# Patient Record
Sex: Female | Born: 1955 | Race: White | Hispanic: No
Health system: Southern US, Community
[De-identification: ages and names within clinical notes are randomized; demographics above are authoritative.]

---

## 2004-11-20 ENCOUNTER — Emergency Department: Payer: Self-pay | Admitting: Emergency Medicine

## 2006-05-30 ENCOUNTER — Ambulatory Visit: Payer: Self-pay | Admitting: Unknown Physician Specialty

## 2007-06-07 ENCOUNTER — Ambulatory Visit: Payer: Self-pay | Admitting: Unknown Physician Specialty

## 2014-05-05 ENCOUNTER — Observation Stay: Payer: Self-pay | Admitting: Surgery

## 2014-07-20 LAB — SURGICAL PATHOLOGY

## 2014-07-26 NOTE — H&P (Signed)
Subjective/Chief Complaint severe RLQ abdominal pain   History of Present Illness 59 y/o o/w healthy female presents with 14 hours of severe RLQ crampy abominal pain and some nausea.  no sick contacts. no diarrhea.  Pain is constant, gassy and located in RLQ and radiating into her right flank as well. W/u in Er suggestive of acute appendicitis.  No prior history of similar type pain.  Accompanied by her son.   Past History migraine headaches. foot surgert in past. no abdominal operations.   Code Status Full Code   Past Med/Surgical Hx:  Gallstones:   Migraines:   Denies surgical history.:   ALLERGIES:  No Known Allergies:   HOME MEDICATIONS: Medication Instructions Status  Imitrex   once a day  Active  Allegra   once a day  Active   Family and Social History:  Family History Hypertension  Smoking   Social History negative tobacco, negative ETOH, negative Illicit drugs   Place of Living Home   Review of Systems:  Subjective/Chief Complaint see above, remaining 10 point review negative.   Abdominal Pain Yes   Nausea/Vomiting Yes   Tolerating Diet No  last meal last evening.   Medications/Allergies Reviewed Medications/Allergies reviewed   Physical Exam:  GEN no acute distress, thin, disheveled, 98.2 94 bp 151/75 rr 18.   HEENT pale conjunctivae, PERRL   NECK supple   RESP normal resp effort  clear BS   CARD regular rate   ABD positive tenderness  positive Flank Tenderness  no hernia  soft  tender in rlq and along right flank.  postive mcburney's sign   LYMPH negative neck   EXTR negative cyanosis/clubbing   SKIN normal to palpation, No rashes   NEURO cranial nerves intact   PSYCH A+O to time, place, person, good insight   Lab Results: Hepatic:  09-Feb-16 11:56   Bilirubin, Total -  Alkaline Phosphatase -  SGPT (ALT) -  SGOT (AST) -  Total Protein, Serum -  Albumin, Serum -    12:45   Bilirubin, Total 0.3  Alkaline Phosphatase 86  SGPT  (ALT) 57  SGOT (AST) 34  Total Protein, Serum 7.1  Albumin, Serum 3.8  Routine Chem:  09-Feb-16 11:56   Lipase - (Result(s) reported on 05 May 2014 at 12:39PM.)  Glucose, Serum -  BUN -  Creatinine (comp) -  Sodium, Serum -  Potassium, Serum -  Chloride, Serum -  CO2, Serum -  Calcium (Total), Serum -  Osmolality (calc) -  eGFR (African American) -  eGFR (Non-African American) - (eGFR values <70m/min/1.73 m2 may be an indication of chronic kidney disease (CKD). Calculated eGFR, using the MRDR Study equation, is useful in  patients with stable renal function. The eGFR calculation will not be reliable in acutely ill patients when serum creatinine is changing rapidly. It is not useful in patients on dialysis. The eGFR calculation may not be applicable to patients at the low and high extremes of body sizes, pregnant women, and vegetarians.)  Anion Gap -  Result Comment Chemistries - Specimen grossly hemolyzed. Unable to  - perform testing. Spoke with EGatha Mayer@ [id number]/16. BGB  Result(s) reported on 05 May 2014 at 12:38PM.    12:45   Lipase 153 (Result(s) reported on 05 May 2014 at 01:09PM.)  Glucose, Serum  135  BUN  21  Creatinine (comp) 0.85  Sodium, Serum 138  Potassium, Serum 3.7  Chloride, Serum 106  CO2, Serum 23  Calcium (Total),  Serum 9.0  Osmolality (calc) 281  eGFR (African American) >60  eGFR (Non-African American) >60 (eGFR values <28m/min/1.73 m2 may be an indication of chronic kidney disease (CKD). Calculated eGFR, using the MRDR Study equation, is useful in  patients with stable renal function. The eGFR calculation will not be reliable in acutely ill patients when serum creatinine is changing rapidly. It is not useful in patients on dialysis. The eGFR calculation may not be applicable to patients at the low and high extremes of body sizes, pregnant women, and vegetarians.)  Anion Gap 9  Cardiac:  09-Feb-16 11:56   Troponin I -  (0.00-0.05 0.05 ng/mL or less: NEGATIVE  Repeat testing in 3-6 hrs  if clinically indicated. >0.05 ng/mL: POTENTIAL  MYOCARDIAL INJURY. Repeat  testing in 3-6 hrs if  clinically indicated. NOTE: An increase or decrease  of 30% or more on serial  testing suggests a  clinically important change)    12:45   Troponin I < 0.02 (0.00-0.05 0.05 ng/mL or less: NEGATIVE  Repeat testing in 3-6 hrs  if clinically indicated. >0.05 ng/mL: POTENTIAL  MYOCARDIAL INJURY. Repeat  testing in 3-6 hrs if  clinically indicated. NOTE: An increase or decrease  of 30% or more on serial  testing suggests a  clinically important change)  Routine UA:  09-Feb-16 11:56   Color (UA) Yellow  Clarity (UA) Hazy  Glucose (UA) Negative  Bilirubin (UA) Negative  Ketones (UA) Negative  Specific Gravity (UA) 1.027  Blood (UA) Negative  pH (UA) 5.0  Protein (UA) Negative  Nitrite (UA) Negative  Leukocyte Esterase (UA) Negative (Result(s) reported on 05 May 2014 at 02:33PM.)  RBC (UA) 2 /HPF  WBC (UA) <1 /HPF  Bacteria (UA) TRACE  Epithelial Cells (UA) 1 /HPF  Mucous (UA) PRESENT  Calcium Oxalate Crystal (UA) PRESENT (Result(s) reported on 05 May 2014 at 02:33PM.)  Routine Hem:  09-Feb-16 11:56   WBC (CBC)  15.3  RBC (CBC) 4.73  Hemoglobin (CBC) 14.4  Hematocrit (CBC) 44.5  Platelet Count (CBC) 331  MCV 94  MCH 30.5  MCHC 32.4  RDW 13.5  Neutrophil % 86.4  Lymphocyte % 10.0  Monocyte % 2.9  Eosinophil % 0.1  Basophil % 0.6  Neutrophil #  13.2  Lymphocyte # 1.5  Monocyte # 0.5  Eosinophil # 0.0  Basophil # 0.1 (Result(s) reported on 05 May 2014 at 12:25PM.)   Radiology Results: [place]    09-Feb-16 14:53, [place]  [place]  REASON FOR EXAM:    abd pain  COMMENTS:       PROCEDURE: [place] - [place]  - May 05 2014  2:53PM     CLINICAL DATA:  Sudden onset abdominal pain and distention with  nausea and vomiting    EXAM:  ULTRASOUND  ABDOMEN COMPLETE    COMPARISON:  None.    FINDINGS:  Gallbladder: No gallstones or wall thickening visualized. No  sonographic Murphy sign noted.    Common bile duct: Diameter: 5 mm    Liver: Prominent liver echogenicity with poor visualization of  portal triads. No focal lesion.    IVC: No abnormality visualized.    Pancreas: Mostly obscured by gas, visualized portion unremarkable.    Spleen: Size and appearance within normal limits.    Right Kidney: Length: 11.5 cm. Echogenicity within normal limits. No  solid mass or hydronephrosis visualized.  Left Kidney: Length: 12.5 cm. Echogenicity within normal limits. No  mass or  hydronephrosis visualized.    Abdominal aorta: No aneurysm visualized.    Other findings: None.     IMPRESSION:  1. No findings to explain acute abdominal pain. The gallbladder is  negative.  2. Probable hepatic steatosis.      Electronically Signed    By: Monte Fantasia M.D.    On: 05/05/2014 15:10         Verified By: Gilford Silvius, M.D.,  [facility]:  PACS Image    09-Feb-16 15:59, CT Abdomen and Pelvis With Contrast  PACS Image  CT:  CT Abdomen and Pelvis With Contrast  REASON FOR EXAM:    (1) abd pain; (2) pel pain  COMMENTS:       PROCEDURE: CT  - CT ABDOMEN / PELVIS  W  - May 05 2014  3:59PM     CLINICAL DATA:  Acute onset of right upper quadrant pain with nausea  this morning.    EXAM:  CT ABDOMEN AND PELVIS WITH CONTRAST    TECHNIQUE:  Multidetector CT imaging of the abdomen and pelvis was performed  using the standard protocol following bolus administration of  intravenous contrast.  CONTRAST:  100 cc Omnipaque 300 IV.    COMPARISON:  None.    FINDINGS:  Minimal dependent and bibasilar atelectasis. Trace right pleural  effusion. Heart is normal size.    Diffuse fatty infiltration of the liver. No focal abnormality.  Gallbladder, spleen, pancreas, adrenals and kidneys are normal.    The appendix is located  lateral to the ascending colon, extending  towards the tip of the liver. The appendix is dilated with  surrounding inflammation compatible with acute appendicitis.  Appendix diameter approximately 10 mm. Probable appendicolith near  the base of the appendix.    Trace free fluid in the pelvis. Uterus, adnexae and urinary bladder  grossly unremarkable. Stomach, large and small bowel are  unremarkable.     IMPRESSION:  Dilated, inflamed appendix extending lateral to the ascending colon  towards the inferior edge of the liver. Findings compatible with  acute appendicitis.    Trace free fluid in the pelvis and trace right pleural effusion.    Diffuse fatty infiltration of the liver.  Electronically Signed    By: Rolm Baptise M.D.    On: 05/05/2014 16:16         Verified By: Raelyn Number, M.D.,    Assessment/Admission Diagnosis 60 y/o female with acute appendicitis I personally reviewed the CT scan images on PACS monitor. Migraine headaches-  I allowed her to take own imitrex after consulting with anesthesia.   Plan admit, IV zosyn already given in ER. Plan lap appendectomy later tonight with Dr Burt Knack all questions addressed and she and her son wish to proceed.   Electronic Signatures: Sherri Rad (MD)  (Signed 09-Feb-16 18:47)  Authored: CHIEF COMPLAINT and HISTORY, PAST MEDICAL/SURGIAL HISTORY, ALLERGIES, HOME MEDICATIONS, FAMILY AND SOCIAL HISTORY, REVIEW OF SYSTEMS, PHYSICAL EXAM, LABS, Radiology, ASSESSMENT AND PLAN   Last Updated: 09-Feb-16 18:47 by Sherri Rad (MD)

## 2014-07-26 NOTE — Op Note (Signed)
PATIENT NAME:  [name], Kristen Steele DATE OF BIRTH:  03/15/1956  DATE OF PROCEDURE:  05/05/2014  PREOPERATIVE DIAGNOSIS: Acute appendicitis.   POSTOPERATIVE DIAGNOSIS: Acute appendicitis.   PROCEDURE: Laparoscopic appendectomy.   SURGEON: Richard E. Excell Seltzerooper, MD   ANESTHESIA: General with endotracheal tube.   INDICATIONS: This is a patient with progressive right lower quadrant and right flank pain with a workup showing appendicitis. Preoperatively, we discussed rationale for surgery, the options of observation, risks of bleeding, infection, recurrence of symptoms, failure to resolve her symptoms, open procedure, bowel injury, and negative laparoscopy. This was all reviewed for her. She understood and agreed to proceed.   FINDINGS: Acute appendicitis. The appendix was right lateral but not retrocecal.   DESCRIPTION OF PROCEDURE: Patient was induced to general anesthesia. A Foley catheter was placed, VTE prophylaxis was in place, and IV antibiotics been given. She was then prepped and draped in a sterile fashion. Marcaine was infiltrated in skin and subcutaneous tissues around the periumbilical area. An incision was made. Veress needle was placed. Pneumoperitoneum was obtained. A 5 mm trocar port was placed. The abdominal cavity was explored and under direct vision a 5 mm suprapubic port and left lateral 12 mm port was placed. The appendix was identified in the right lateral position. It was elevated. The base of the appendix was divided with an EndoGIA standard load and then 2 firings of a vascular load EndoGIA was fired across the mesoappendix. The specimen was passed out through the lateral port site with the aid of an Endo catch bag. The area was checked for hemostasis and found to be adequate. It was irrigated with copious amounts of normal saline. There was no sign of bleeding or bowel injury. Therefore, under direct vision the left lateral port site was closed with Endo close technique  utilizing simple sutures of 0 Vicryl. Again, hemostasis was checked and found to be adequate. Pneumoperitoneum was released. All ports were removed; 4-0 subcuticular Monocryl was used at all skin edges. Steri-Strips, Mastisol, and sterile dressings were placed.   The patient tolerated the procedure well. There were no complications. She was taken to the recovery room in stable condition to be admitted for continued care.    ____________________________ Adah Salvageichard E. Excell Seltzerooper, MD rec:bm D: 05/05/2014 21:15:50 ET T: 05/05/2014 23:18:41 ET JOB#: [id number]  cc: Adah Salvageichard E. Excell Seltzerooper, MD, <Dictator> Lattie HawICHARD E COOPER MD ELECTRONICALLY SIGNED 05/06/2014 6:33

## 2016-07-14 IMAGING — CT CT ABD-PELV W/ CM
2 of 5 series · 16 of 46 positions shown, 18 images · IV contrast (omnipaque)
Comparison: None.

CLINICAL DATA: Acute onset of right upper quadrant pain with nausea
this morning.

EXAM:
CT ABDOMEN AND PELVIS WITH CONTRAST
TECHNIQUE: Multidetector CT imaging of the abdomen and pelvis was performed
using the standard protocol following bolus administration of
intravenous contrast.
CONTRAST:  100 cc Omnipaque 300 IV.

[Series 2: routine abd pel with · axial · 0.72mm/px · z∈[-498,-63]mm · 13 of 99 slices shown, 15 images]
[im 6/99  soft-tissue]
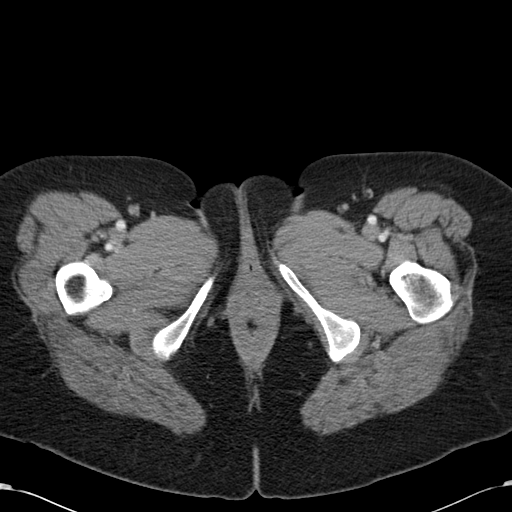
[im 6/99  bone]
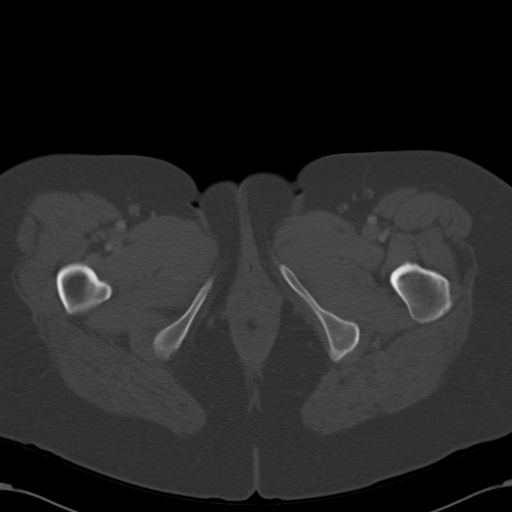
[im 16/99  soft-tissue]
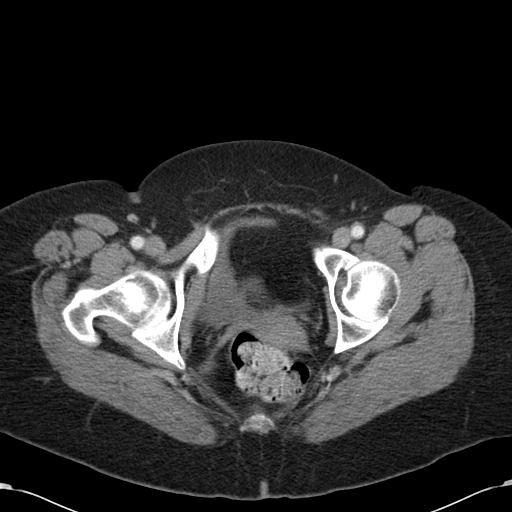
[im 21/99  soft-tissue]
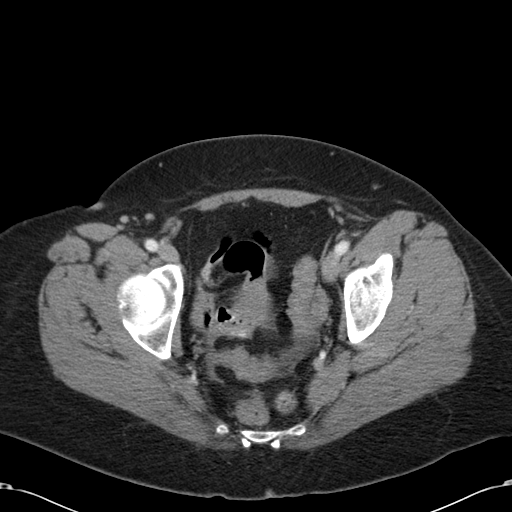
[im 26/99  soft-tissue]
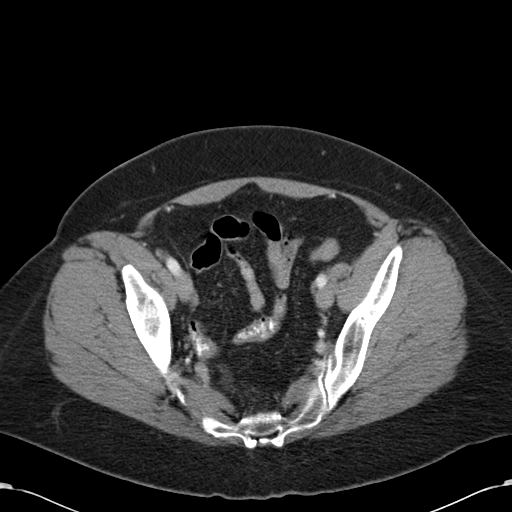
[im 37/99  soft-tissue]
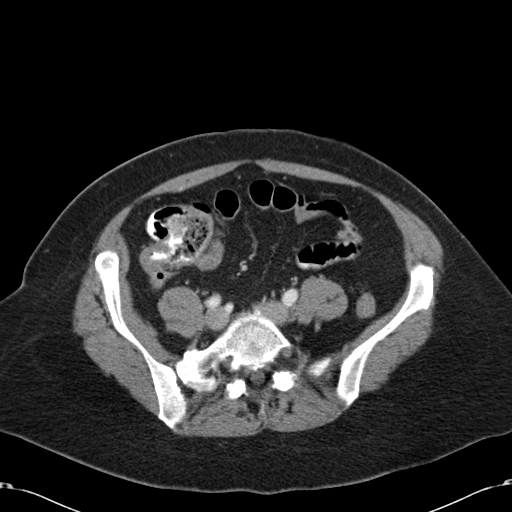
[im 42/99  soft-tissue]
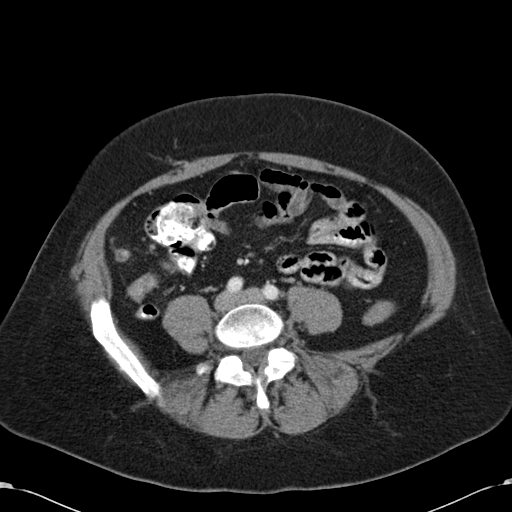
[im 52/99  soft-tissue]
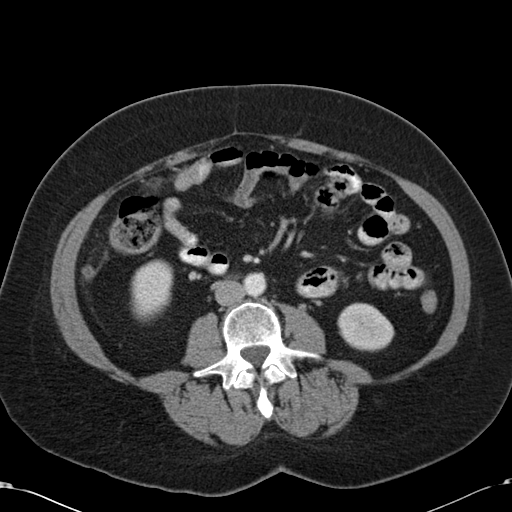
[im 57/99  soft-tissue]
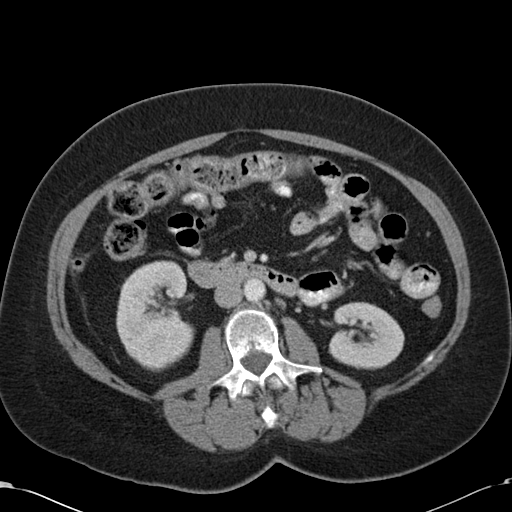
[im 62/99  soft-tissue]
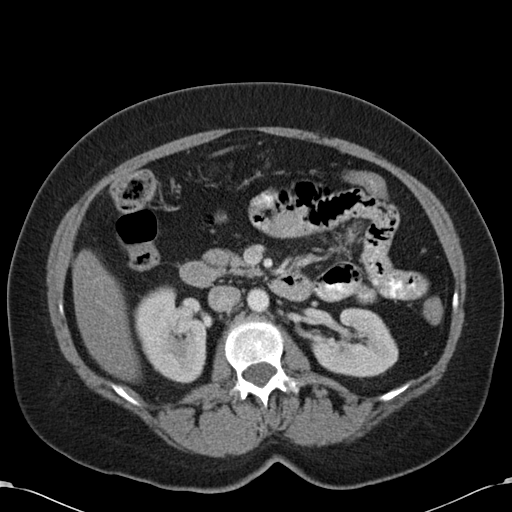
[im 62/99  bone]
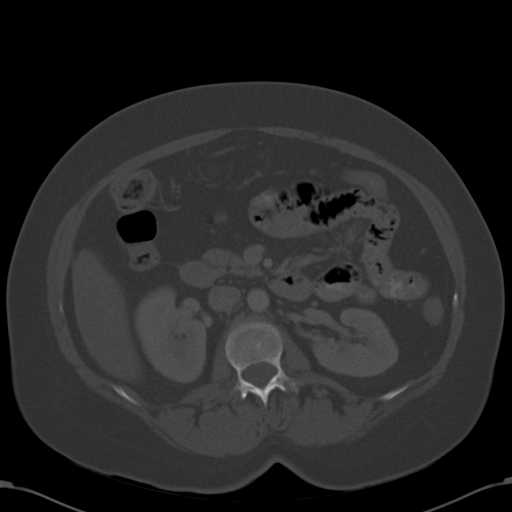
[im 73/99  soft-tissue]
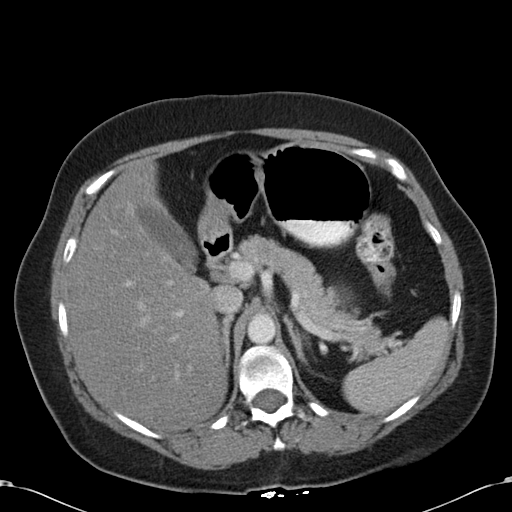
[im 78/99  soft-tissue]
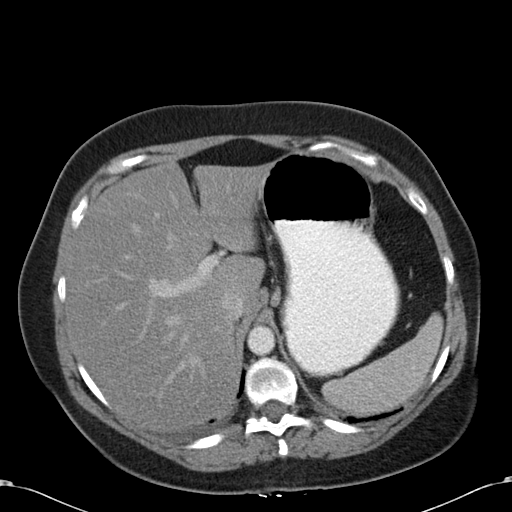
[im 83/99  soft-tissue]
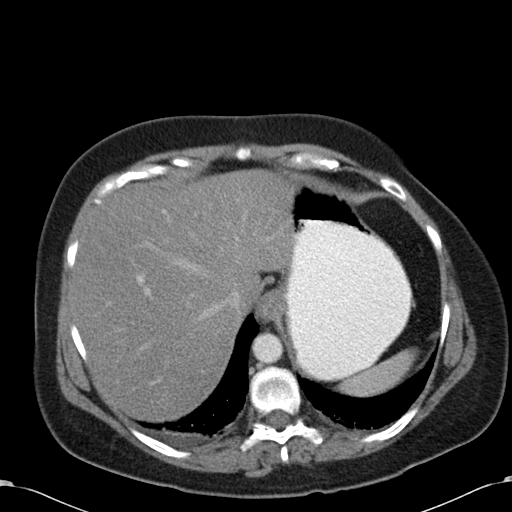
[im 93/99  soft-tissue]
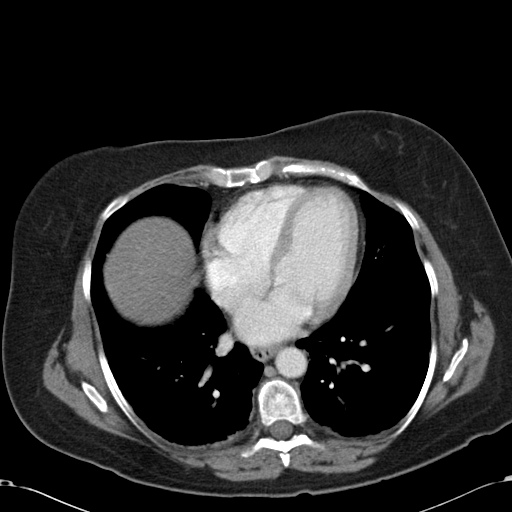

[Series 5: cor routine abd pel with · coronal · 0.73mm/px · 3 of 145 slices shown]
[im 49/145  soft-tissue]
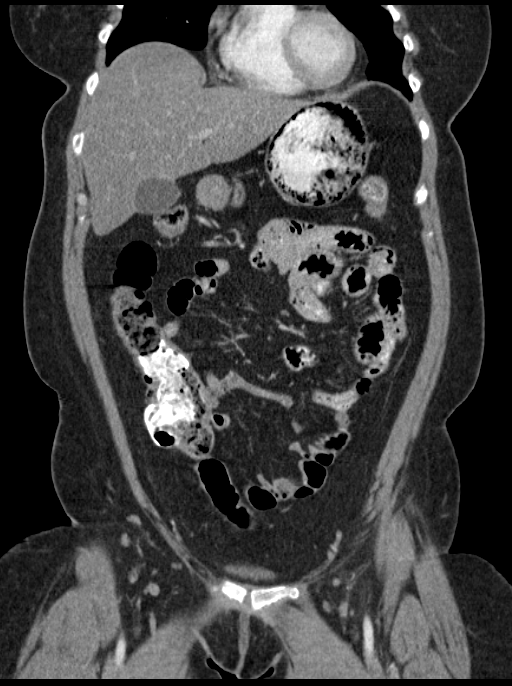
[im 65/145  soft-tissue]
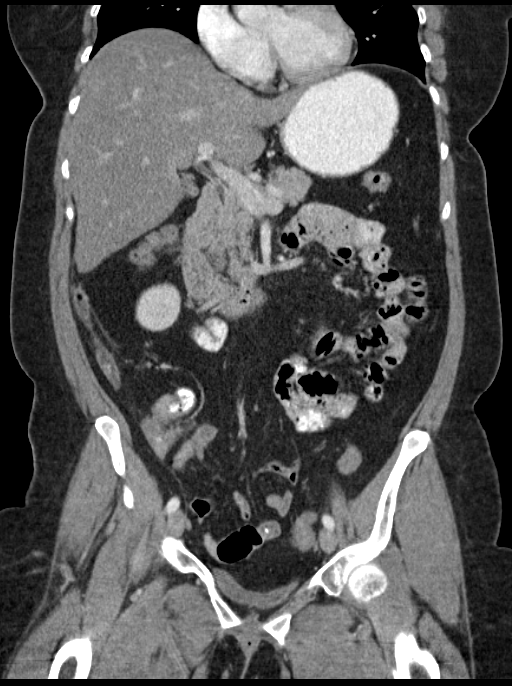
[im 81/145  soft-tissue]
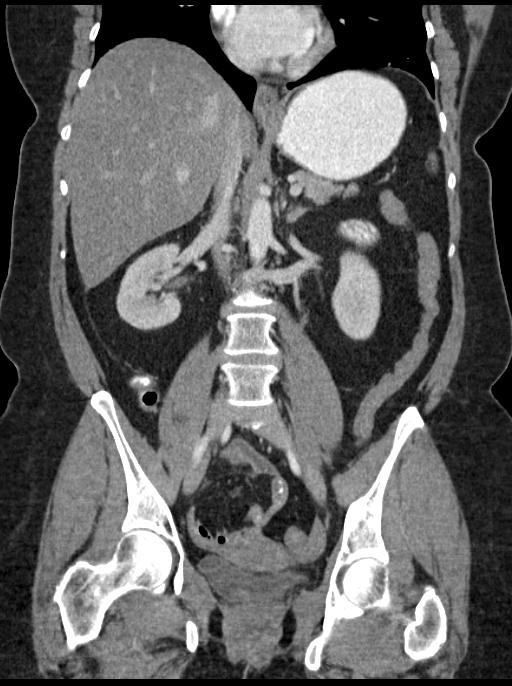

[16 of 46 positions shown; findings below may reference images not displayed]

FINDINGS: Minimal dependent and bibasilar atelectasis. Trace right pleural
effusion. Heart is normal size.

Diffuse fatty infiltration of the liver. No focal abnormality.
Gallbladder, spleen, pancreas, adrenals and kidneys are normal.

The appendix is located lateral to the ascending colon, extending
towards the tip of the liver. The appendix is dilated with
surrounding inflammation compatible with acute appendicitis.
Appendix diameter approximately 10 mm. Probable appendicolith near
the base of the appendix.

Trace free fluid in the pelvis. Uterus, adnexae and urinary bladder
grossly unremarkable. Stomach, large and small bowel are
unremarkable.
IMPRESSION: Dilated, inflamed appendix extending lateral to the ascending colon
towards the inferior edge of the liver. Findings compatible with
acute appendicitis.

Trace free fluid in the pelvis and trace right pleural effusion.

Diffuse fatty infiltration of the liver.

## 2019-06-07 ENCOUNTER — Ambulatory Visit: Payer: Self-pay

## 2019-07-03 ENCOUNTER — Ambulatory Visit: Payer: Self-pay

## 2022-08-15 ENCOUNTER — Ambulatory Visit: Payer: Self-pay | Admitting: Internal Medicine
